# Patient Record
Sex: Female | Born: 1985 | Race: White | Hispanic: No | Marital: Married | Smoking: Never smoker
Health system: Southern US, Community
[De-identification: ages and names within clinical notes are randomized; demographics above are authoritative.]

## PROBLEM LIST (undated history)

## (undated) DIAGNOSIS — F32A Depression, unspecified: Secondary | ICD-10-CM

## (undated) DIAGNOSIS — F329 Major depressive disorder, single episode, unspecified: Secondary | ICD-10-CM

## (undated) DIAGNOSIS — F419 Anxiety disorder, unspecified: Secondary | ICD-10-CM

## (undated) HISTORY — DX: Depression, unspecified: F32.A

## (undated) HISTORY — PX: TONSILLECTOMY: SHX5217

## (undated) HISTORY — DX: Anxiety disorder, unspecified: F41.9

## (undated) HISTORY — PX: WISDOM TOOTH EXTRACTION: SHX21

---
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
## 2018-11-14 ENCOUNTER — Encounter: Payer: Self-pay | Admitting: Advanced Practice Midwife

## 2018-11-14 ENCOUNTER — Ambulatory Visit (INDEPENDENT_AMBULATORY_CARE_PROVIDER_SITE_OTHER): Payer: BC Managed Care – PPO | Admitting: Advanced Practice Midwife

## 2018-11-14 ENCOUNTER — Other Ambulatory Visit: Payer: Self-pay

## 2018-11-14 VITALS — BP 100/70 | Ht 64.0 in | Wt 183.0 lb

## 2018-11-14 DIAGNOSIS — Z01419 Encounter for gynecological examination (general) (routine) without abnormal findings: Secondary | ICD-10-CM | POA: Diagnosis not present

## 2018-11-14 DIAGNOSIS — Z30011 Encounter for initial prescription of contraceptive pills: Secondary | ICD-10-CM

## 2018-11-14 MED ORDER — LEVONORGESTREL-ETHINYL ESTRAD 0.15-30 MG-MCG PO TABS: 1.0000 | ORAL_TABLET | Freq: Every day | ORAL | 4 refills | Status: DC

## 2018-11-14 NOTE — Progress Notes (Signed)
Gynecology Annual Exam   PCP: Patient, No Pcp Per  Chief Complaint:  Chief Complaint  Patient presents with  . Gynecologic Exam    History of Present Illness: Patient is a 33 y.o. G1P1001 presents for annual exam. The patient has no gyn complaints today. Her OCP was changed to a generic when she moved here and it has changed her periods to several days of spotting followed by her period. She would like to go back to taking the original pill that was prescribed- Levora.  She moved here from [place] in July. She reports her last PAP smear about a year ago and has never had an abnormal PAP. Requested she sign a release for previous records.   LMP: Patient's last menstrual period was 11/05/2018 (approximate). Average Interval: regular, 28 days Duration of flow: 5 days Heavy Menses: no Clots: no Intermenstrual Bleeding: no Postcoital Bleeding: no Dysmenorrhea: no  The patient is sexually active. She currently uses OCP (estrogen/progesterone) for contraception. She denies dyspareunia. The patient does perform self breast exams.  There is no notable family history of breast or ovarian cancer in her family.  The patient wears seatbelts: yes.   The patient has regular exercise: she walks regularly, she admits healthy lifestyle diet, hydration and sleep.    The patient denies current symptoms of depression.  She was taking Wellbutrin for postpartum depression (her child is 25 years old) but since moving she has not needed to take the medication.   Review of Systems: Review of Systems  Constitutional: Negative.   HENT: Negative.   Eyes:       Redness, irritation  Respiratory: Negative.   Cardiovascular: Negative.   Gastrointestinal: Negative.   Genitourinary: Negative.   Musculoskeletal: Negative.   Skin: Negative.   Neurological: Negative.   Endo/Heme/Allergies: Negative.   Psychiatric/Behavioral: Negative.     Past Medical History:  Past Medical History:  Diagnosis Date   . Anxiety   . Depression     Past Surgical History:  Past Surgical History:  Procedure Laterality Date  . TONSILLECTOMY    . WISDOM TOOTH EXTRACTION      Gynecologic History:  Patient's last menstrual period was 11/05/2018 (approximate). Contraception: OCP (estrogen/progesterone) Last Pap: 1 year ago Results were: no abnormalities   Obstetric History: G1P1001  Family History:  Family History  Problem Relation Age of Onset  . Hypertension Father     Social History:  Social History   Socioeconomic History  . Marital status: Married    Spouse name: Not on file  . Number of children: Not on file  . Years of education: Not on file  . Highest education level: Not on file  Occupational History  . Not on file  Social Needs  . Financial resource strain: Not on file  . Food insecurity    Worry: Not on file    Inability: Not on file  . Transportation needs    Medical: Not on file    Non-medical: Not on file  Tobacco Use  . Smoking status: Never Smoker  . Smokeless tobacco: Never Used  Substance and Sexual Activity  . Alcohol use: Yes  . Drug use: Never  . Sexual activity: Yes    Birth control/protection: Pill  Lifestyle  . Physical activity    Days per week: Not on file    Minutes per session: Not on file  . Stress: Not on file  Relationships  . Social Musician on phone: Not  on file    Gets together: Not on file    Attends religious service: Not on file    Active member of club or organization: Not on file    Attends meetings of clubs or organizations: Not on file    Relationship status: Not on file  . Intimate partner violence    Fear of current or ex partner: Not on file    Emotionally abused: Not on file    Physically abused: Not on file    Forced sexual activity: Not on file  Other Topics Concern  . Not on file  Social History Narrative  . Not on file    Allergies:  Allergies  Allergen Reactions  . Singulair [Montelukast Sodium]  Hives    Medications: Prior to Admission medications   Medication Sig Start Date End Date Taking? Authorizing Provider  traZODone (DESYREL) 50 MG tablet TAKE 1/2 TO 2 TABLETS BY MOUTH AT BEDTIME AS NEEDED 10/19/18  Yes [provider]  levonorgestrel-ethinyl estradiol (LEVORA 0.15/30, 28,) 0.15-30 MG-MCG tablet Take 1 tablet by mouth daily. 11/14/18   Rod Can, CNM    Physical Exam Vitals: Blood pressure 100/70, height 5\' 4"  (1.626 m), weight 183 lb (83 kg), last menstrual period 11/05/2018.  General: NAD HEENT: normocephalic, anicteric Thyroid: no enlargement, no palpable nodules Pulmonary: No increased work of breathing, CTAB Cardiovascular: RRR, distal pulses 2+ Breast: Breast symmetrical, no tenderness, no palpable nodules or masses, no skin or nipple retraction present, no nipple discharge.  No axillary or supraclavicular lymphadenopathy. Abdomen: NABS, soft, non-tender, non-distended.  Umbilicus without lesions.  No hepatomegaly, splenomegaly or masses palpable. No evidence of hernia  Genitourinary: Deferred for no concerns/PAP interval Extremities: no edema, erythema, or tenderness Neurologic: Grossly intact Psychiatric: mood appropriate, affect full    Assessment: 33 y.o. G1P1001 routine annual exam  Plan: Problem List Items Addressed This Visit    None    Visit Diagnoses    Encounter for initial prescription of contraceptive pills    -  Primary   Relevant Medications   levonorgestrel-ethinyl estradiol (LEVORA 0.15/30, 28,) 0.15-30 MG-MCG tablet      1) STI screening  was offered and declined  2)  ASCCP guidelines and rationale discussed.  Patient opts for every 3 years screening interval  3) Contraception - the patient is currently using  OCP (estrogen/progesterone).  She is happy with her current form of contraception and plans to continue  4) Routine healthcare maintenance including cholesterol, diabetes screening discussed Declines  5) Return  in about 1 year (around 11/14/2019) for annual established gyn/sign release for previous PAP records.   Rod Can, [facility] 11/14/2018, 3:58 PM

## 2018-11-14 NOTE — Patient Instructions (Signed)
Health Maintenance, Female Adopting a healthy lifestyle and getting preventive care are important in promoting health and wellness. Ask your health care provider about:  The right schedule for you to have regular tests and exams.  Things you can do on your own to prevent diseases and keep yourself healthy. What should I know about diet, weight, and exercise? Eat a healthy diet   Eat a diet that includes plenty of vegetables, fruits, low-fat dairy products, and lean protein.  Do not eat a lot of foods that are high in solid fats, added sugars, or sodium. Maintain a healthy weight Body mass index (BMI) is used to identify weight problems. It estimates body fat based on height and weight. Your health care provider can help determine your BMI and help you achieve or maintain a healthy weight. Get regular exercise Get regular exercise. This is one of the most important things you can do for your health. Most adults should:  Exercise for at least 150 minutes each week. The exercise should increase your heart rate and make you sweat (moderate-intensity exercise).  Do strengthening exercises at least twice a week. This is in addition to the moderate-intensity exercise.  Spend less time sitting. Even light physical activity can be beneficial. Watch cholesterol and blood lipids Have your blood tested for lipids and cholesterol at 31 years of age, then have this test every 5 years. Have your cholesterol levels checked more often if:  Your lipid or cholesterol levels are high.  You are older than 31 years of age.  You are at high risk for heart disease. What should I know about cancer screening? Depending on your health history and family history, you may need to have cancer screening at various ages. This may include screening for:  Breast cancer.  Cervical cancer.  Colorectal cancer.  Skin cancer.  Lung cancer. What should I know about heart disease, diabetes, and high blood  pressure? Blood pressure and heart disease  High blood pressure causes heart disease and increases the risk of stroke. This is more likely to develop in people who have high blood pressure readings, are of African descent, or are overweight.  Have your blood pressure checked: ? Every 3-5 years if you are 18-39 years of age. ? Every year if you are 40 years old or older. Diabetes Have regular diabetes screenings. This checks your fasting blood sugar level. Have the screening done:  Once every three years after age 40 if you are at a normal weight and have a low risk for diabetes.  More often and at a younger age if you are overweight or have a high risk for diabetes. What should I know about preventing infection? Hepatitis B If you have a higher risk for hepatitis B, you should be screened for this virus. Talk with your health care provider to find out if you are at risk for hepatitis B infection. Hepatitis C Testing is recommended for:  Everyone born from 1945 through 1965.  Anyone with known risk factors for hepatitis C. Sexually transmitted infections (STIs)  Get screened for STIs, including gonorrhea and chlamydia, if: ? You are sexually active and are younger than 31 years of age. ? You are older than 31 years of age and your health care provider tells you that you are at risk for this type of infection. ? Your sexual activity has changed since you were last screened, and you are at increased risk for chlamydia or gonorrhea. Ask your health care provider if   you are at risk.  Ask your health care provider about whether you are at high risk for HIV. Your health care provider may recommend a prescription medicine to help prevent HIV infection. If you choose to take medicine to prevent HIV, you should first get tested for HIV. You should then be tested every 3 months for as long as you are taking the medicine. Pregnancy  If you are about to stop having your period (premenopausal) and  you may become pregnant, seek counseling before you get pregnant.  Take 400 to 800 micrograms (mcg) of folic acid every day if you become pregnant.  Ask for birth control (contraception) if you want to prevent pregnancy. Osteoporosis and menopause Osteoporosis is a disease in which the bones lose minerals and strength with aging. This can result in bone fractures. If you are 65 years old or older, or if you are at risk for osteoporosis and fractures, ask your health care provider if you should:  Be screened for bone loss.  Take a calcium or vitamin D supplement to lower your risk of fractures.  Be given hormone replacement therapy (HRT) to treat symptoms of menopause. Follow these instructions at home: Lifestyle  Do not use any products that contain nicotine or tobacco, such as cigarettes, e-cigarettes, and chewing tobacco. If you need help quitting, ask your health care provider.  Do not use street drugs.  Do not share needles.  Ask your health care provider for help if you need support or information about quitting drugs. Alcohol use  Do not drink alcohol if: ? Your health care provider tells you not to drink. ? You are pregnant, may be pregnant, or are planning to become pregnant.  If you drink alcohol: ? Limit how much you use to 0-1 drink a day. ? Limit intake if you are breastfeeding.  Be aware of how much alcohol is in your drink. In the [place] one drink equals one 12 oz bottle of beer (355 mL), one 5 oz glass of wine (148 mL), or one 1 oz glass of hard liquor (44 mL). General instructions  Schedule regular health, dental, and eye exams.  Stay current with your vaccines.  Tell your health care provider if: ? You often feel depressed. ? You have ever been abused or do not feel safe at home. Summary  Adopting a healthy lifestyle and getting preventive care are important in promoting health and wellness.  Follow your health care provider's instructions about healthy  diet, exercising, and getting tested or screened for diseases.  Follow your health care provider's instructions on monitoring your cholesterol and blood pressure. This information is not intended to replace advice given to you by your health care provider. Make sure you discuss any questions you have with your health care provider. Document Released: 07/31/2010 Document Revised: 01/08/2018 Document Reviewed: 01/08/2018 Elsevier Patient Education  2020 [employer]  

## 2019-12-30 ENCOUNTER — Other Ambulatory Visit: Payer: Self-pay | Admitting: Advanced Practice Midwife

## 2019-12-30 DIAGNOSIS — Z30011 Encounter for initial prescription of contraceptive pills: Secondary | ICD-10-CM

## 2019-12-31 MED ORDER — LEVONORGESTREL-ETHINYL ESTRAD 0.15-30 MG-MCG PO TABS: 1.0000 | ORAL_TABLET | Freq: Every day | ORAL | 1 refills | Status: DC

## 2019-12-31 NOTE — Telephone Encounter (Addendum)
Chart reviewed patient schedule apt for AE 02/04/2020. rf sent. Pt notified.

## 2019-12-31 NOTE — Addendum Note (Signed)
Addended by: Kathlene Cote on: 12/31/2019 10:23 AM   Modules accepted: Orders

## 2019-12-31 NOTE — Telephone Encounter (Addendum)
Patient requesting refill of birth control. She will schedule AE. She needs to start a new pack on Sunday. NO#[phone]

## 2020-02-04 ENCOUNTER — Other Ambulatory Visit: Payer: Self-pay

## 2020-02-04 ENCOUNTER — Encounter: Payer: Self-pay | Admitting: Obstetrics and Gynecology

## 2020-02-04 ENCOUNTER — Other Ambulatory Visit (HOSPITAL_COMMUNITY)
Admission: RE | Admit: 2020-02-04 | Discharge: 2020-02-04 | Disposition: A | Discharge status: Home / Self Care | Payer: BC Managed Care – PPO | Source: Ambulatory Visit | Attending: Obstetrics and Gynecology | Admitting: Obstetrics and Gynecology

## 2020-02-04 ENCOUNTER — Ambulatory Visit (INDEPENDENT_AMBULATORY_CARE_PROVIDER_SITE_OTHER): Payer: BC Managed Care – PPO | Admitting: Obstetrics and Gynecology

## 2020-02-04 ENCOUNTER — Ambulatory Visit: Payer: BC Managed Care – PPO | Admitting: Advanced Practice Midwife

## 2020-02-04 VITALS — BP 118/70 | Ht 64.0 in | Wt 194.4 lb

## 2020-02-04 DIAGNOSIS — Z124 Encounter for screening for malignant neoplasm of cervix: Secondary | ICD-10-CM

## 2020-02-04 DIAGNOSIS — Z0000 Encounter for general adult medical examination without abnormal findings: Secondary | ICD-10-CM

## 2020-02-04 DIAGNOSIS — Z3041 Encounter for surveillance of contraceptive pills: Secondary | ICD-10-CM | POA: Diagnosis not present

## 2020-02-04 DIAGNOSIS — Z01419 Encounter for gynecological examination (general) (routine) without abnormal findings: Secondary | ICD-10-CM | POA: Diagnosis not present

## 2020-02-04 DIAGNOSIS — Z30011 Encounter for initial prescription of contraceptive pills: Secondary | ICD-10-CM

## 2020-02-04 MED ORDER — LEVONORGESTREL-ETHINYL ESTRAD 0.15-30 MG-MCG PO TABS: 1.0000 | ORAL_TABLET | Freq: Every day | ORAL | 12 refills | Status: DC

## 2020-02-04 NOTE — Patient Instructions (Signed)
Institute of Medicine Recommended Dietary Allowances for Calcium and Vitamin D  Age (yr) Calcium Recommended Dietary Allowance (mg/day) Vitamin D Recommended Dietary Allowance (international units/day)  9-18 1,300 600  19-50 1,000 600  51-70 1,200 600  71 and older 1,200 800  Data from Institute of Medicine. Dietary reference intakes: calcium, vitamin D. [place], [place]: National Academies Press; 2011.     Exercising to Stay Healthy To become healthy and stay healthy, it is recommended that you do moderate-intensity and vigorous-intensity exercise. You can tell that you are exercising at a moderate intensity if your heart starts beating faster and you start breathing faster but can still hold a conversation. You can tell that you are exercising at a vigorous intensity if you are breathing much harder and faster and cannot hold a conversation while exercising. Exercising regularly is important. It has many health benefits, such as:  Improving overall fitness, flexibility, and endurance.  Increasing bone density.  Helping with weight control.  Decreasing body fat.  Increasing muscle strength.  Reducing stress and tension.  Improving overall health. How often should I exercise? Choose an activity that you enjoy, and set realistic goals. Your health care provider can help you make an activity plan that works for you. Exercise regularly as told by your health care provider. This may include:  Doing strength training two times a week, such as: ? Lifting weights. ? Using resistance bands. ? Push-ups. ? Sit-ups. ? Yoga.  Doing a certain intensity of exercise for a given amount of time. Choose from these options: ? A total of 150 minutes of moderate-intensity exercise every week. ? A total of 75 minutes of vigorous-intensity exercise every week. ? A mix of moderate-intensity and vigorous-intensity exercise every week. Children, pregnant women, people who have not exercised  regularly, people who are overweight, and older adults may need to talk with a health care provider about what activities are safe to do. If you have a medical condition, be sure to talk with your health care provider before you start a new exercise program. What are some exercise ideas? Moderate-intensity exercise ideas include:  Walking 1 mile (1.6 km) in about 15 minutes.  Biking.  Hiking.  Golfing.  Dancing.  Water aerobics. Vigorous-intensity exercise ideas include:  Walking 4.5 miles (7.2 km) or more in about 1 hour.  Jogging or running 5 miles (8 km) in about 1 hour.  Biking 10 miles (16.1 km) or more in about 1 hour.  Lap swimming.  Roller-skating or in-line skating.  Cross-country skiing.  Vigorous competitive sports, such as football, basketball, and soccer.  Jumping rope.  Aerobic dancing. What are some everyday activities that can help me to get exercise?  Yard work, such as: ? Pushing a lawn mower. ? Raking and bagging leaves.  Washing your car.  Pushing a stroller.  Shoveling snow.  Gardening.  Washing windows or floors. How can I be more active in my day-to-day activities?  Use stairs instead of an elevator.  Take a walk during your lunch break.  If you drive, park your car farther away from your work or school.  If you take public transportation, get off one stop early and walk the rest of the way.  Stand up or walk around during all of your indoor phone calls.  Get up, stretch, and walk around every 30 minutes throughout the day.  Enjoy exercise with a friend. Support to continue exercising will help you keep a regular routine of activity. What guidelines can   I follow while exercising?  Before you start a new exercise program, talk with your health care provider.  Do not exercise so much that you hurt yourself, feel dizzy, or get very short of breath.  Wear comfortable clothes and wear shoes with good support.  Drink plenty of  water while you exercise to prevent dehydration or heat stroke.  Work out until your breathing and your heartbeat get faster. Where to find more information  [place] Department of Health and Human Services: www.hhs.gov  Centers for Disease Control and Prevention (CDC): www.cdc.gov Summary  Exercising regularly is important. It will improve your overall fitness, flexibility, and endurance.  Regular exercise also will improve your overall health. It can help you control your weight, reduce stress, and improve your bone density.  Do not exercise so much that you hurt yourself, feel dizzy, or get very short of breath.  Before you start a new exercise program, talk with your health care provider. This information is not intended to replace advice given to you by your health care provider. Make sure you discuss any questions you have with your health care provider. Document Revised: 12/28/2016 Document Reviewed: 12/06/2016 Elsevier Patient Education  2020 Elsevier Inc.   Budget-Friendly Healthy Eating There are many ways to save money at the grocery store and continue to eat healthy. You can be successful if you:  Plan meals according to your budget.  Make a grocery list and only purchase food according to your grocery list.  Prepare food yourself. What are tips for following this plan?  Reading food labels  Compare food labels between brand name foods and the store brand. Often the nutritional value is the same, but the store brand is lower cost.  Look for products that do not have added sugar, fat, or salt (sodium). These often cost the same but are healthier for you. Products may be labeled as: ? Sugar-free. ? Nonfat. ? Low-fat. ? Sodium-free. ? Low-sodium.  Look for lean ground beef labeled as at least 92% lean and 8% fat. Shopping  Buy only the items on your grocery list and go only to the areas of the store that have the items on your list.  Use coupons only for foods  and brands you normally buy. Avoid buying items you wouldn't normally buy simply because they are on sale.  Check online and in newspapers for weekly deals.  Buy healthy items from the bulk bins when available, such as herbs, spices, flour, pasta, nuts, and dried fruit.  Buy fruits and vegetables that are in season. Prices are usually lower on in-season produce.  Look at the unit price on the price tag. Use it to compare different brands and sizes to find out which item is the best deal.  Choose healthy items that are often low-cost, such as carrots, potatoes, apples, bananas, and oranges. Dried or canned beans are a low-cost protein source.  Buy in bulk and freeze extra food. Items you can buy in bulk include meats, fish, poultry, frozen fruits, and frozen vegetables.  Avoid buying "ready-to-eat" foods, such as pre-cut fruits and vegetables and pre-made salads.  If possible, shop around to discover where you can find the best prices. Consider other retailers such as dollar stores, larger wholesale stores, local fruit and vegetable stands, and farmers markets.  Do not shop when you are hungry. If you shop while hungry, it may be hard to stick to your list and budget.  Resist impulse buying. Use your grocery list as   your official plan for the week.  Buy a variety of vegetables and fruits by purchasing fresh, frozen, and canned items.  Look at the top and bottom shelves for deals. Foods at eye level (eye level of an adult or child) are usually more expensive.  Be efficient with your time when shopping. The more time you spend at the store, the more money you are likely to spend.  To save money when choosing more expensive foods like meats and dairy: ? Choose cheaper cuts of meat, such as bone-in chicken thighs and drumsticks instead of skinless and boneless chicken. When you are ready to prepare the chicken, you can remove the skin yourself to make it healthier. ? Choose lean meats like  chicken or turkey instead of beef. ? Choose canned seafood, such as tuna, salmon, or sardines. ? Buy eggs as a low-cost source of protein. ? Buy dried beans and peas, such as lentils, split peas, or kidney beans instead of meats. Dried beans and peas are a good alternative source of protein. ? Buy the larger tubs of yogurt instead of individual-sized containers.  Choose water instead of sodas and other sweetened beverages.  Avoid buying chips, cookies, and other "junk food." These items are usually expensive and not healthy. Cooking  Make extra food and freeze the extras in meal-sized containers or in individual portions for fast meals and snacks.  Pre-cook on days when you have extra time to prepare meals in advance. You can keep these meals in the fridge or freezer and reheat for a quick meal.  When you come home from the grocery store, wash, peel, and cut fruits and vegetables so they are ready to use and eat. This will help reduce food waste. Meal planning  Do not eat out or get fast food. Prepare food at home.  Make a grocery list and make sure to bring it with you to the store. If you have a smart phone, you could use your phone to create your shopping list.  Plan meals and snacks according to a grocery list and budget you create.  Use leftovers in your meal plan for the week.  Look for recipes where you can cook once and make enough food for two meals.  Include budget-friendly meals like stews, casseroles, and stir-fry dishes.  Try some meatless meals or try "no cook" meals like salads.  Make sure that half your plate is filled with fruits or vegetables. Choose from fresh, frozen, or canned fruits and vegetables. If eating canned, remember to rinse them before eating. This will remove any excess salt added for packaging. Summary  Eating healthy on a budget is possible if you plan your meals according to your budget, purchase according to your budget and grocery list, and  prepare food yourself.  Tips for buying more food on a limited budget include buying generic brands, using coupons only for foods you normally buy, and buying healthy items from the bulk bins when available.  Tips for buying cheaper food to replace expensive food include choosing cheaper, lean cuts of meat, and buying dried beans and peas. This information is not intended to replace advice given to you by your health care provider. Make sure you discuss any questions you have with your health care provider. Document Revised: 01/16/2017 Document Reviewed: 01/16/2017 Elsevier Patient Education  2020 Elsevier Inc.   Bone Health Bones protect organs, store calcium, anchor muscles, and support the whole body. Keeping your bones strong is important, especially as you   get older. You can take actions to help keep your bones strong and healthy. Why is keeping my bones healthy important?  Keeping your bones healthy is important because your body constantly replaces bone cells. Cells get old, and new cells take their place. As we age, we lose bone cells because the body may not be able to make enough new cells to replace the old cells. The amount of bone cells and bone tissue you have is referred to as bone mass. The higher your bone mass, the stronger your bones. The aging process leads to an overall loss of bone mass in the body, which can increase the likelihood of:  Joint pain and stiffness.  Broken bones.  A condition in which the bones become weak and brittle (osteoporosis). A large decline in bone mass occurs in older adults. In women, it occurs about the time of menopause. What actions can I take to keep my bones healthy? Good health habits are important for maintaining healthy bones. This includes eating nutritious foods and exercising regularly. To have healthy bones, you need to get enough of the right minerals and vitamins. Most nutrition experts recommend getting these nutrients from the  foods that you eat. In some cases, taking supplements may also be recommended. Doing certain types of exercise is also important for bone health. What are the nutritional recommendations for healthy bones?  Eating a well-balanced diet with plenty of calcium and vitamin D will help to protect your bones. Nutritional recommendations vary from person to person. Ask your health care provider what is healthy for you. Here are some general guidelines. Get enough calcium Calcium is the most important (essential) mineral for bone health. Most people can get enough calcium from their diet, but supplements may be recommended for people who are at risk for osteoporosis. Good sources of calcium include:  Dairy products, such as low-fat or nonfat milk, cheese, and yogurt.  Dark green leafy vegetables, such as bok choy and broccoli.  Calcium-fortified foods, such as orange juice, cereal, bread, soy beverages, and tofu products.  Nuts, such as almonds. Follow these recommended amounts for daily calcium intake:  Children, age 1-3: 700 mg.  Children, age 4-8: 1,000 mg.  Children, age 9-13: 1,300 mg.  Teens, age 14-18: 1,300 mg.  Adults, age 19-50: 1,000 mg.  Adults, age 51-70: ? Men: 1,000 mg. ? Women: 1,200 mg.  Adults, age 71 or older: 1,200 mg.  Pregnant and breastfeeding females: ? Teens: 1,300 mg. ? Adults: 1,000 mg. Get enough vitamin D Vitamin D is the most essential vitamin for bone health. It helps the body absorb calcium. Sunlight stimulates the skin to make vitamin D, so be sure to get enough sunlight. If you live in a cold climate or you do not get outside often, your health care provider may recommend that you take vitamin D supplements. Good sources of vitamin D in your diet include:  Egg yolks.  Saltwater fish.  Milk and cereal fortified with vitamin D. Follow these recommended amounts for daily vitamin D intake:  Children and teens, age 1-18: 600 international  units.  Adults, age 50 or younger: 400-800 international units.  Adults, age 51 or older: 800-1,000 international units. Get other important nutrients Other nutrients that are important for bone health include:  Phosphorus. This mineral is found in meat, poultry, dairy foods, nuts, and legumes. The recommended daily intake for adult men and adult women is 700 mg.  Magnesium. This mineral is found in seeds, nuts, dark   green vegetables, and legumes. The recommended daily intake for adult men is 400-420 mg. For adult women, it is 310-320 mg.  Vitamin K. This vitamin is found in green leafy vegetables. The recommended daily intake is 120 mg for adult men and 90 mg for adult women. What type of physical activity is best for building and maintaining healthy bones? Weight-bearing and strength-building activities are important for building and maintaining healthy bones. Weight-bearing activities cause muscles and bones to work against gravity. Strength-building activities increase the strength of the muscles that support bones. Weight-bearing and muscle-building activities include:  Walking and hiking.  Jogging and running.  Dancing.  Gym exercises.  Lifting weights.  Tennis and racquetball.  Climbing stairs.  Aerobics. Adults should get at least 30 minutes of moderate physical activity on most days. Children should get at least 60 minutes of moderate physical activity on most days. Ask your health care provider what type of exercise is best for you. How can I find out if my bone mass is low? Bone mass can be measured with an X-ray test called a bone mineral density (BMD) test. This test is recommended for all women who are age 65 or older. It may also be recommended for:  Men who are age 70 or older.  People who are at risk for osteoporosis because of: ? Having bones that break easily. ? Having a long-term disease that weakens bones, such as kidney disease or rheumatoid  arthritis. ? Having menopause earlier than normal. ? Taking medicine that weakens bones, such as steroids, thyroid hormones, or hormone treatment for breast cancer or prostate cancer. ? Smoking. ? Drinking three or more alcoholic drinks a day. If you find that you have a low bone mass, you may be able to prevent osteoporosis or further bone loss by changing your diet and lifestyle. Where can I find more information? For more information, check out the following websites:  National Osteoporosis Foundation: www.nof.org/patients  National Institutes of Health: www.bones.nih.gov  International Osteoporosis Foundation: www.iofbonehealth.org Summary  The aging process leads to an overall loss of bone mass in the body, which can increase the likelihood of broken bones and osteoporosis.  Eating a well-balanced diet with plenty of calcium and vitamin D will help to protect your bones.  Weight-bearing and strength-building activities are also important for building and maintaining strong bones.  Bone mass can be measured with an X-ray test called a bone mineral density (BMD) test. This information is not intended to replace advice given to you by your health care provider. Make sure you discuss any questions you have with your health care provider. Document Revised: 02/11/2017 Document Reviewed: 02/11/2017 Elsevier Patient Education  2020 Elsevier Inc.   

## 2020-02-04 NOTE — Progress Notes (Signed)
Gynecology Annual Exam  PCP: Patient, No Pcp Per  Chief Complaint:  Chief Complaint  Patient presents with  . Gynecologic Exam    Annual Exam    History of Present Illness: Patient is a 35 y.o. G1P1001 presents for annual exam. The patient has no complaints today.   LMP: No LMP recorded. (Menstrual status: Oral contraceptives). Average Interval: regular, 28 days Duration of flow: 4 days Heavy Menses: no Intermenstrual Bleeding: no Dysmenorrhea: no  The patient is sexually active. She denies dyspareunia.  Postcoital Bleeding: no She currently uses OCP (estrogen/progesterone) for contraception.    The patient does perform self breast exams.  There is no notable family history of breast or ovarian cancer in her family.  The patient has regular exercise: yes, walking and weights 3-4 days a week  The patient denies current symptoms of depression.   Reports today she had blurry vision in her left eye and a headache, resolved with taking tylenol and motrin.  Review of Systems: Review of Systems  Constitutional: Negative for chills, fever, malaise/fatigue and weight loss.  HENT: Negative for congestion, hearing loss and sinus pain.   Eyes: Positive for blurred vision. Negative for double vision.  Respiratory: Negative for cough, sputum production, shortness of breath and wheezing.   Cardiovascular: Negative for chest pain, palpitations, orthopnea and leg swelling.  Gastrointestinal: Negative for abdominal pain, constipation, diarrhea, nausea and vomiting.  Genitourinary: Negative for dysuria, flank pain, frequency, hematuria and urgency.  Musculoskeletal: Negative for back pain, falls and joint pain.  Skin: Negative for itching and rash.  Neurological: Positive for headaches. Negative for dizziness.  Psychiatric/Behavioral: Negative for depression, substance abuse and suicidal ideas. The patient is not nervous/anxious.     Past Medical History:  Past Medical History:   Diagnosis Date  . Anxiety   . Depression     Past Surgical History:  Past Surgical History:  Procedure Laterality Date  . TONSILLECTOMY    . WISDOM TOOTH EXTRACTION      Gynecologic History:  No LMP recorded. (Menstrual status: Oral contraceptives). Contraception: OCP (estrogen/progesterone) Last Pap: Results were: 2017 , uncertain result  Obstetric History: G1P1001  Family History:  Family History  Problem Relation Age of Onset  . Hypertension Father     Social History:  Social History   Socioeconomic History  . Marital status: Married    Spouse name: Not on file  . Number of children: Not on file  . Years of education: Not on file  . Highest education level: Not on file  Occupational History  . Not on file  Tobacco Use  . Smoking status: Never Smoker  . Smokeless tobacco: Never Used  Vaping Use  . Vaping Use: Never used  Substance and Sexual Activity  . Alcohol use: Yes  . Drug use: Never  . Sexual activity: Yes    Birth control/protection: Pill  Other Topics Concern  . Not on file  Social History Narrative  . Not on file   Social Determinants of Health   Financial Resource Strain: Not on file  Food Insecurity: Not on file  Transportation Needs: Not on file  Physical Activity: Not on file  Stress: Not on file  Social Connections: Not on file  Intimate Partner Violence: Not on file    Allergies:  Allergies  Allergen Reactions  . Singulair [Montelukast Sodium] Hives    Medications: Prior to Admission medications   Medication Sig Start Date End Date Taking? Authorizing Provider  traZODone (DESYREL) 50  MG tablet TAKE 1/2 TO 2 TABLETS BY MOUTH AT BEDTIME AS NEEDED 10/19/18  Yes [provider]  venlafaxine XR (EFFEXOR-XR) 37.5 MG 24 hr capsule Take by mouth. 01/28/20 01/27/21 Yes [provider]  levonorgestrel-ethinyl estradiol (LEVORA 0.15/30, 28,) 0.15-30 MG-MCG tablet Take 1 tablet by mouth daily. 02/04/20   Homero Fellers, MD    Physical Exam Vitals: Blood pressure 118/70, height 5\' 4"  (1.626 m), weight 194 lb 6.4 oz (88.2 kg).  Physical Exam Constitutional:      Appearance: She is well-developed.  Genitourinary:     Vagina and uterus normal.     There is no lesion on the right labia.     There is no lesion on the left labia.    No lesions in the vagina.     Genitourinary Comments: External: Normal appearing vulva. No lesions noted.  Speculum examination: Normal appearing cervix. No blood in the vaginal vault. No discharge.   Bimanual examination: Uterus midline, non-tender, normal in size, shape and contour.  No CMT. No adnexal masses. No adnexal tenderness. Pelvis not fixed.       Right Adnexa: no mass present.    Left Adnexa: no mass present.    No cervical motion tenderness.  Breasts:     Right: No inverted nipple, mass, nipple discharge or skin change.     Left: No inverted nipple, mass, nipple discharge or skin change.    HENT:     Head: Normocephalic and atraumatic.  Eyes:     Extraocular Movements: EOM normal.  Neck:     Thyroid: No thyromegaly.  Cardiovascular:     Rate and Rhythm: Normal rate and regular rhythm.     Heart sounds: Normal heart sounds.  Pulmonary:     Effort: Pulmonary effort is normal.     Breath sounds: Normal breath sounds.  Abdominal:     General: Bowel sounds are normal. There is no distension.     Palpations: Abdomen is soft. There is no mass.  Musculoskeletal:     Cervical back: Neck supple.  Neurological:     Mental Status: She is alert and oriented to person, place, and time.  Skin:    General: Skin is warm and dry.  Psychiatric:        Mood and Affect: Mood and affect normal.        Behavior: Behavior normal.        Thought Content: Thought content normal.        Judgment: Judgment normal.  Vitals reviewed. Exam conducted with a chaperone present.      Female chaperone present for pelvic and breast  portions of the physical  exam  Assessment: 35 y.o. G1P1001 routine annual exam  Plan: Problem List Items Addressed This Visit   None   Visit Diagnoses    Encounter for annual routine gynecological examination    -  Primary   Health maintenance examination       Encounter for gynecological examination without abnormal finding       Cervical cancer screening       Relevant Orders   Cytology - PAP   Encounter for birth control pills maintenance       Encounter for initial prescription of contraceptive pills       Relevant Medications   levonorgestrel-ethinyl estradiol (LEVORA 0.15/30, 28,) 0.15-30 MG-MCG tablet      1) STI screening was offered and declined  2) ASCCP guidelines and rational discussed.  Patient opts for  every 3 years screening interval  3) Contraception - Education given regarding options for contraception, patient desires to continue OCP.  4) Routine healthcare maintenance including cholesterol, diabetes screening discussed managed by PCP  5) Follow up if headaches or vision changes are recurrent.   Adelene Idler MD, [facility], [facility] 02/04/2020 2:50 PM

## 2020-02-10 LAB — CYTOLOGY - PAP
Comment: NEGATIVE
Diagnosis: NEGATIVE
High risk HPV: NEGATIVE

## 2020-02-17 ENCOUNTER — Other Ambulatory Visit: Payer: Self-pay | Admitting: Advanced Practice Midwife

## 2020-02-17 DIAGNOSIS — Z30011 Encounter for initial prescription of contraceptive pills: Secondary | ICD-10-CM

## 2020-03-12 ENCOUNTER — Other Ambulatory Visit: Payer: Self-pay | Admitting: Obstetrics and Gynecology

## 2020-03-12 DIAGNOSIS — Z30011 Encounter for initial prescription of contraceptive pills: Secondary | ICD-10-CM

## 2020-04-14 ENCOUNTER — Other Ambulatory Visit: Payer: Self-pay | Admitting: Obstetrics and Gynecology

## 2020-04-14 DIAGNOSIS — Z30011 Encounter for initial prescription of contraceptive pills: Secondary | ICD-10-CM

## 2020-09-30 ENCOUNTER — Encounter: Payer: Self-pay | Admitting: Obstetrics and Gynecology

## 2020-09-30 ENCOUNTER — Ambulatory Visit (INDEPENDENT_AMBULATORY_CARE_PROVIDER_SITE_OTHER): Payer: BC Managed Care – PPO | Admitting: Obstetrics and Gynecology

## 2020-09-30 ENCOUNTER — Other Ambulatory Visit: Payer: Self-pay

## 2020-09-30 VITALS — BP 118/70 | Ht 64.0 in | Wt 193.6 lb

## 2020-09-30 DIAGNOSIS — N6311 Unspecified lump in the right breast, upper outer quadrant: Secondary | ICD-10-CM | POA: Diagnosis not present

## 2020-09-30 NOTE — Progress Notes (Signed)
Patient ID: Michelle Sexton, female   DOB: Dec 12, 1985, 35 y.o.   MRN: [id number]  Reason for Consult: Gynecologic Exam   Referred by No ref. provider found  Subjective:     HPI:  Michelle Sexton is a 35 y.o. female she reports that over the last 6 months she has been noticing breast pain in her right breast.  She reports that this pain is present almost every day.  She has not been able to find anything to help with relief.  She has not tried hot or cold compresses.  She has taken a muscle relaxant in the past which did not improve the pain.  Sometimes she will feel sharp electrical pain throughout the breast.  Gynecological History  Patient's last menstrual period was 09/05/2020.  Past Medical History:  Diagnosis Date   Anxiety    Depression    Family History  Problem Relation Age of Onset   Hypertension Father    Past Surgical History:  Procedure Laterality Date   TONSILLECTOMY     WISDOM TOOTH EXTRACTION      Short Social History:  Social History   Tobacco Use   Smoking status: Never   Smokeless tobacco: Never  Substance Use Topics   Alcohol use: Yes    Allergies  Allergen Reactions   Singulair [Montelukast Sodium] Hives    Current Outpatient Medications  Medication Sig Dispense Refill   buPROPion (WELLBUTRIN SR) 150 MG 12 hr tablet Take 150 mg by mouth 2 (two) times daily.     traZODone (DESYREL) 50 MG tablet TAKE 1/2 TO 2 TABLETS BY MOUTH AT BEDTIME AS NEEDED     ALTAVERA 0.15-30 MG-MCG tablet TAKE 1 TABLET BY MOUTH EVERY DAY (Patient not taking: Reported on 09/30/2020) 28 tablet 11   venlafaxine XR (EFFEXOR-XR) 37.5 MG 24 hr capsule Take by mouth.     No current facility-administered medications for this visit.    Review of Systems  Constitutional: Negative for chills, fatigue, fever and unexpected weight change.  HENT: Negative for trouble swallowing.  Eyes: Negative for loss of vision.  Respiratory: Negative for cough, shortness of breath and wheezing.   Cardiovascular: Negative for chest pain, leg swelling, palpitations and syncope.  GI: Negative for abdominal pain, blood in stool, diarrhea, nausea and vomiting.  GU: Negative for difficulty urinating, dysuria, frequency and hematuria.  Musculoskeletal: Negative for back pain, leg pain and joint pain.  Skin: Negative for rash.  Neurological: Negative for dizziness, headaches, light-headedness, numbness and seizures.  Psychiatric: Negative for behavioral problem, confusion, depressed mood and sleep disturbance.       Objective:  Objective   Vitals:   09/30/20 1540  BP: 118/70  Weight: 193 lb 9.6 oz (87.8 kg)  Height: 5\' 4"  (1.626 m)   Body mass index is 33.23 kg/m.  Physical Exam Chest:      Assessment/Plan:     35 yo with Breast pain, patient with fibrocystic breasts small breast lump present in the right breast Breast imaging ordered. Discussed supportive care for breast pain including hot and cold compresses use of Tylenol and ibuprofen as well as compression.  Discussed avoidance of caffeine and use of primrose oil.  Discussed that if these interventions are not successful she can follow-up for review of secondary steps for breast pain management.  More than 20 minutes were spent face to face with the patient in the room, reviewing the medical record, labs and images, and coordinating care for the patient. The plan of management was  discussed in detail and counseling was provided.      Adelene Idler MD [facility], [facility] 09/30/2020 3:55 PM

## 2020-09-30 NOTE — Patient Instructions (Signed)
PATIENT INSTRUCTIONS  FIBROCYSTIC BREAST DISEASE    FOLLOW-UP:  Call your physician should you develop a new breast mass that is different, if one particular lump starts to enlarge, or if nipple discharge develops.     CAUSE:  Many women have some lumpiness within their breasts and these areas at times can become tender during certain times in your menstrual cycle.  These areas tend to feel like a firm rubber ball as compared to a cancer which will more commonly feel hard and almost rock-like.  Fibrocystic breast disease does not in and of itself increase your risk for breast cancer but you should be sure to examine yiour breasts at the same time of the month on a monthly basis.  If there are a lot of areas of lumpiness you should tape a piece of paper on the mirror with a diagram of your breasts, noting where the areas of lumpiness are and their relative size.  You can then refer to this diagram on a monthly basis to keep better track of any changes should they occur.    DIET:  You should try and avoid foods, or at least minimize foods, such as chocolate and caffeine which may cause the symptoms of tenderness to become worse.    ACTIVITY:  You may want to wear a bra that offers additional support, and/or consider a sports bra, especially during those times when your breasts are more tender.    MEDICATIONS:  Taking Vitamin E capsules twice a day along with Evening of Primrose Capsules three times a day, or as directed on the bottle, may help your symptoms.  These are both available over-the-counter and without a prescription. There is clinical evidence that these may help symptoms in some patients.   If your physician has prescribed medication for your fibrocystic breast disease, be sure to take it as instructed on the bottle and let him/her know if you have any side effects.    QUESTIONS:  Please feel free to call your physician  if you have any questions, and they will be glad to assist you.

## 2020-10-06 ENCOUNTER — Telehealth: Payer: Self-pay

## 2020-10-06 NOTE — Telephone Encounter (Signed)
Patient called and said [facility] is requesting diagnostic bilateral [id number]. I sent msg to [facility] to place order.

## 2020-10-10 ENCOUNTER — Other Ambulatory Visit: Payer: Self-pay | Admitting: Obstetrics and Gynecology

## 2020-10-10 ENCOUNTER — Telehealth: Payer: Self-pay

## 2020-10-10 DIAGNOSIS — Z1231 Encounter for screening mammogram for malignant neoplasm of breast: Secondary | ICD-10-CM

## 2020-10-10 NOTE — Telephone Encounter (Signed)
Patient called and said that [facility] needed an additional order for [id number]. I have requested this from [facility]

## 2020-10-14 ENCOUNTER — Ambulatory Visit
Admission: RE | Admit: 2020-10-14 | Discharge: 2020-10-14 | Disposition: A | Discharge status: Home / Self Care | Payer: BC Managed Care – PPO | Source: Ambulatory Visit | Attending: Obstetrics and Gynecology | Admitting: Obstetrics and Gynecology

## 2020-10-14 ENCOUNTER — Other Ambulatory Visit: Payer: Self-pay

## 2020-10-14 DIAGNOSIS — N6311 Unspecified lump in the right breast, upper outer quadrant: Secondary | ICD-10-CM | POA: Insufficient documentation

## 2020-10-14 DIAGNOSIS — Z1231 Encounter for screening mammogram for malignant neoplasm of breast: Secondary | ICD-10-CM | POA: Diagnosis not present

## 2021-01-23 ENCOUNTER — Ambulatory Visit
Admission: EM | Admit: 2021-01-23 | Discharge: 2021-01-23 | Disposition: A | Discharge status: Home / Self Care | Payer: BC Managed Care – PPO | Attending: Emergency Medicine | Admitting: Emergency Medicine

## 2021-01-23 ENCOUNTER — Ambulatory Visit: Admit: 2021-01-23 | Payer: BC Managed Care – PPO

## 2021-01-23 ENCOUNTER — Other Ambulatory Visit: Payer: Self-pay

## 2021-01-23 DIAGNOSIS — J069 Acute upper respiratory infection, unspecified: Secondary | ICD-10-CM

## 2021-01-23 MED ORDER — BENZONATATE 100 MG PO CAPS: 200.0000 mg | ORAL_CAPSULE | Freq: Three times a day (TID) | ORAL | 0 refills | Status: DC

## 2021-01-23 MED ORDER — IPRATROPIUM BROMIDE 0.06 % NA SOLN: 2.0000 | Freq: Four times a day (QID) | NASAL | 12 refills | Status: DC

## 2021-01-23 MED ORDER — PROMETHAZINE-DM 6.25-15 MG/5ML PO SYRP: 5.0000 mL | ORAL_SOLUTION | Freq: Four times a day (QID) | ORAL | 0 refills | Status: DC | PRN

## 2021-01-23 NOTE — Discharge Instructions (Signed)
Use the Atrovent nasal spray, 2 squirts in each nostril every 6 hours, as needed for runny nose and postnasal drip.  Use the Tessalon Perles every 8 hours during the day.  Take them with a small sip of water.  They may give you some numbness to the base of your tongue or a metallic taste in your mouth, this is normal.  Use the Promethazine DM cough syrup at bedtime for cough and congestion.  It will make you drowsy so do not take it during the day.  Use OTC Tylenol and Ibuprofen for fever or body aches.   Return for reevaluation or see your primary care provider for any new or worsening symptoms.

## 2021-01-23 NOTE — ED Provider Notes (Signed)
[facility]    CSN: [id number] Arrival date & time: 01/23/21  1221      History   Chief Complaint Chief Complaint  Patient presents with   Cough    HPI Michelle Sexton is a 35 y.o. female.   HPI  35 year old female here for evaluation of respiratory complaints.  Patient reports that she has been experiencing runny nose nasal congestion, sore throat, hoarseness, bilateral ear pain with right being worse than left, ringing in her ears, productive cough for green sputum, and nausea.  She denies fever, shortness breath or wheezing, vomiting or diarrhea  Past Medical History:  Diagnosis Date   Anxiety    Depression     There are no problems to display for this patient.   Past Surgical History:  Procedure Laterality Date   TONSILLECTOMY     WISDOM TOOTH EXTRACTION      OB History     Gravida  1   Para  1   Term  1   Preterm      AB      Living  1      SAB      IAB      Ectopic      Multiple      Live Births  1            Home Medications    Prior to Admission medications   Medication Sig Start Date End Date Taking? Authorizing Provider  ALTAVERA 0.15-30 MG-MCG tablet TAKE 1 TABLET BY MOUTH EVERY DAY 04/14/20  Yes Schuman, Christanna R, MD  benzonatate (TESSALON) 100 MG capsule Take 2 capsules (200 mg total) by mouth every 8 (eight) hours. 01/23/21  Yes Becky Augusta, NP  buPROPion [facility] SR) 150 MG 12 hr tablet Take 150 mg by mouth 2 (two) times daily. 07/07/20  Yes [provider]  ipratropium (ATROVENT) 0.06 % nasal spray Place 2 sprays into both nostrils 4 (four) times daily. 01/23/21  Yes Becky Augusta, NP  promethazine-dextromethorphan (PROMETHAZINE-DM) 6.25-15 MG/5ML syrup Take 5 mLs by mouth 4 (four) times daily as needed. 01/23/21  Yes Becky Augusta, NP  traZODone (DESYREL) 50 MG tablet TAKE 1/2 TO 2 TABLETS BY MOUTH AT BEDTIME AS NEEDED 10/19/18  Yes [provider]  venlafaxine XR (EFFEXOR-XR) 37.5 MG 24  hr capsule Take by mouth. 01/28/20 01/27/21  [provider]    Family History Family History  Problem Relation Age of Onset   Hypertension Father     Social History Social History   Tobacco Use   Smoking status: Never   Smokeless tobacco: Never  Vaping Use   Vaping Use: Never used  Substance Use Topics   Alcohol use: Yes   Drug use: Never     Allergies   Singulair [montelukast sodium]   Review of Systems Review of Systems  Constitutional:  Negative for activity change, appetite change and fever.  HENT:  Positive for congestion, ear pain, postnasal drip, rhinorrhea, sore throat and tinnitus.   Respiratory:  Positive for cough. Negative for shortness of breath and wheezing.   Gastrointestinal:  Positive for nausea. Negative for diarrhea and vomiting.  Skin:  Negative for rash.  Hematological: Negative.   Psychiatric/Behavioral: Negative.      Physical Exam Triage Vital Signs ED Triage Vitals  Enc Vitals Group     BP 01/23/21 1230 115/79     Pulse Rate 01/23/21 1230 79     Resp 01/23/21 1230 18  Temp 01/23/21 1230 99.1 F (37.3 C)     Temp Source 01/23/21 1230 Oral     SpO2 01/23/21 1230 100 %     Weight 01/23/21 1229 190 lb (86.2 kg)     Height 01/23/21 1229 5\' 4"  (1.626 m)     Head Circumference --      Peak Flow --      Pain Score 01/23/21 1229 0     Pain Loc --      Pain Edu? --      Excl. in GC? --    No data found.  Updated Vital Signs BP 115/79 (BP Location: Left Arm)    Pulse 79    Temp 99.1 F (37.3 C) (Oral)    Resp 18    Ht 5\' 4"  (1.626 m)    Wt 190 lb (86.2 kg)    SpO2 100%    BMI 32.61 kg/m   Visual Acuity Right Eye Distance:   Left Eye Distance:   Bilateral Distance:    Right Eye Near:   Left Eye Near:    Bilateral Near:     Physical Exam Vitals and nursing note reviewed.  Constitutional:      General: She is not in acute distress.    Appearance: Normal appearance. She is not ill-appearing.  HENT:     Head:  Normocephalic and atraumatic.     Right Ear: Tympanic membrane, ear canal and external ear normal. There is no impacted cerumen.     Left Ear: Tympanic membrane, ear canal and external ear normal. There is no impacted cerumen.     Nose: Congestion and rhinorrhea present.     Mouth/Throat:     Mouth: Mucous membranes are moist.     Pharynx: Oropharynx is clear. Posterior oropharyngeal erythema present.  Cardiovascular:     Rate and Rhythm: Normal rate and regular rhythm.     Pulses: Normal pulses.     Heart sounds: Normal heart sounds. No murmur heard.   No gallop.  Pulmonary:     Effort: Pulmonary effort is normal.     Breath sounds: Normal breath sounds. No wheezing, rhonchi or rales.  Musculoskeletal:     Cervical back: Normal range of motion and neck supple.  Lymphadenopathy:     Cervical: No cervical adenopathy.  Skin:    General: Skin is warm and dry.     Capillary Refill: Capillary refill takes less than 2 seconds.     Findings: No erythema or rash.  Neurological:     General: No focal deficit present.     Mental Status: She is alert and oriented to person, place, and time.  Psychiatric:        Mood and Affect: Mood normal.        Behavior: Behavior normal.        Thought Content: Thought content normal.        Judgment: Judgment normal.     UC Treatments / Results  Labs (all labs ordered are listed, but only abnormal results are displayed) Labs Reviewed - No data to display  EKG   Radiology No results found.  Procedures Procedures (including critical care time)  Medications Ordered in UC Medications - No data to display  Initial Impression / Assessment and Plan / UC Course  I have reviewed the triage vital signs and the nursing notes.  Pertinent labs & imaging results that were available during my care of the patient were reviewed by me and considered in  my medical decision making (see chart for details).  Patient is a very pleasant, nontoxic-appearing  35 year old female here for evaluation of respiratory complaints as outlined in HPI above.  On physical exam patient has pearly gray tympanic membranes bilaterally with normal light reflex and clear external auditory canals.  Nasal mucosa is erythematous and edematous with clear nasal discharge in both nares.  Oropharyngeal exam reveals posterior oropharyngeal erythema with clear postnasal drip.  No cervical lymphadenopathy appreciated exam.  Cardiopulmonary exam reveals clear lung sounds in all fields.  Patient does have some hoarseness but she does not have a completely muffled voice.  I suspect that her laryngitis is coming from postnasal drip as result of an upper respiratory infection.  We will treat her with Atrovent nasal spray to help calm down the postnasal drip, Tessalon Perles, and Promethazine DM cough syrup for cough and congestion at bedtime.  Work note provided.   Final Clinical Impressions(s) / UC Diagnoses   Final diagnoses:  Viral URI with cough     Discharge Instructions      Use the Atrovent nasal spray, 2 squirts in each nostril every 6 hours, as needed for runny nose and postnasal drip.  Use the Tessalon Perles every 8 hours during the day.  Take them with a small sip of water.  They may give you some numbness to the base of your tongue or a metallic taste in your mouth, this is normal.  Use the Promethazine DM cough syrup at bedtime for cough and congestion.  It will make you drowsy so do not take it during the day.  Use OTC Tylenol and Ibuprofen for fever or body aches.   Return for reevaluation or see your primary care provider for any new or worsening symptoms.      ED Prescriptions     Medication Sig Dispense Auth. Provider   benzonatate (TESSALON) 100 MG capsule Take 2 capsules (200 mg total) by mouth every 8 (eight) hours. 21 capsule Becky Augusta, NP   ipratropium (ATROVENT) 0.06 % nasal spray Place 2 sprays into both nostrils 4 (four) times daily. 15 mL  Becky Augusta, NP   promethazine-dextromethorphan (PROMETHAZINE-DM) 6.25-15 MG/5ML syrup Take 5 mLs by mouth 4 (four) times daily as needed. 118 mL Becky Augusta, NP      PDMP not reviewed this encounter.   Becky Augusta, NP 01/23/21 1243

## 2021-01-23 NOTE — ED Triage Notes (Signed)
Pt here with C/O cough with production, sore throat, loss of voice, ear pain since Friday. Negative at home Covid.

## 2022-08-31 ENCOUNTER — Ambulatory Visit
Admission: EM | Admit: 2022-08-31 | Discharge: 2022-08-31 | Disposition: A | Discharge status: Home / Self Care | Payer: BC Managed Care – PPO | Source: Home / Self Care

## 2022-08-31 DIAGNOSIS — H9201 Otalgia, right ear: Secondary | ICD-10-CM

## 2022-08-31 MED ORDER — AZITHROMYCIN 250 MG PO TABS: 250.0000 mg | ORAL_TABLET | Freq: Every day | ORAL | 0 refills | Status: DC

## 2022-08-31 NOTE — ED Provider Notes (Signed)
[facility]    CSN: [id number] Arrival date & time: 08/31/22  0844      History   Chief Complaint Chief Complaint  Patient presents with   Ear Pain    HPI Michelle Sexton is a 37 y.o. female.   Patient presents for evaluation of right-sided ear pain beginning overnight.  Awoken from sleep due to pain, associated pruritus.  Has accompanying upper respiratory infection, experiencing congestion, cough.  Managing symptoms with Mucinex and additional over-the-counter medications.  Denies presence of fever.    Past Medical History:  Diagnosis Date   Anxiety    Depression     There are no problems to display for this patient.   Past Surgical History:  Procedure Laterality Date   TONSILLECTOMY     WISDOM TOOTH EXTRACTION      OB History     Gravida  1   Para  1   Term  1   Preterm      AB      Living  1      SAB      IAB      Ectopic      Multiple      Live Births  1            Home Medications    Prior to Admission medications   Medication Sig Start Date End Date Taking? Authorizing Provider  ALTAVERA 0.15-30 MG-MCG tablet TAKE 1 TABLET BY MOUTH EVERY DAY 04/14/20  Yes Schuman, Christanna R, MD  amphetamine-dextroamphetamine (ADDERALL XR) 10 MG 24 hr capsule Take by mouth. 08/30/22 11/28/22 Yes [provider]  ipratropium (ATROVENT) 0.06 % nasal spray Place 2 sprays into both nostrils 4 (four) times daily. 01/23/21  Yes Becky Augusta, NP  traZODone (DESYREL) 50 MG tablet TAKE 1/2 TO 2 TABLETS BY MOUTH AT BEDTIME AS NEEDED 10/19/18  Yes [provider]  benzonatate (TESSALON) 100 MG capsule Take 2 capsules (200 mg total) by mouth every 8 (eight) hours. 01/23/21   Becky Augusta, NP  buPROPion [facility] SR) 150 MG 12 hr tablet Take 150 mg by mouth 2 (two) times daily. 07/07/20   [provider]  promethazine-dextromethorphan (PROMETHAZINE-DM) 6.25-15 MG/5ML syrup Take 5 mLs by mouth 4 (four) times daily as needed.  01/23/21   Becky Augusta, NP  venlafaxine XR (EFFEXOR-XR) 37.5 MG 24 hr capsule Take by mouth. 01/28/20 01/27/21  [provider]    Family History Family History  Problem Relation Age of Onset   Hypertension Father     Social History Social History   Tobacco Use   Smoking status: Never   Smokeless tobacco: Never  Vaping Use   Vaping status: Never Used  Substance Use Topics   Alcohol use: Yes   Drug use: Never     Allergies   Singulair [montelukast sodium]   Review of Systems Review of Systems   Physical Exam Triage Vital Signs ED Triage Vitals  Encounter Vitals Group     BP 08/31/22 0850 112/77     Systolic BP Percentile --      Diastolic BP Percentile --      Pulse Rate 08/31/22 0850 74     Resp --      Temp 08/31/22 0850 98.8 F (37.1 C)     Temp Source 08/31/22 0850 Oral     SpO2 08/31/22 0850 100 %     Weight 08/31/22 0849 150 lb (68 kg)     Height 08/31/22 0849 5'  4" (1.626 m)     Head Circumference --      Peak Flow --      Pain Score 08/31/22 0848 4     Pain Loc --      Pain Education --      Exclude from Growth Chart --    No data found.  Updated Vital Signs BP 112/77 (BP Location: Left Arm)   Pulse 74   Temp 98.8 F (37.1 C) (Oral)   Ht 5\' 4"  (1.626 m)   Wt 150 lb (68 kg)   LMP 06/09/2022   SpO2 100%   BMI 25.75 kg/m   Visual Acuity Right Eye Distance:   Left Eye Distance:   Bilateral Distance:    Right Eye Near:   Left Eye Near:    Bilateral Near:     Physical Exam Constitutional:      Appearance: Normal appearance.  HENT:     Left Ear: Hearing, tympanic membrane, ear canal and external ear normal.     Ears:     Comments: Bubbling to the right membrane without erythema, mild swelling within the ear canal without drainage, erythema, no abnormality to the external ear Eyes:     Extraocular Movements: Extraocular movements intact.  Pulmonary:     Effort: Pulmonary effort is normal.  Neurological:     Mental  Status: She is alert and oriented to person, place, and time. Mental status is at baseline.      UC Treatments / Results  Labs (all labs ordered are listed, but only abnormal results are displayed) Labs Reviewed - No data to display  EKG   Radiology No results found.  Procedures Procedures (including critical care time)  Medications Ordered in UC Medications - No data to display  Initial Impression / Assessment and Plan / UC Course  I have reviewed the triage vital signs and the nursing notes.  Pertinent labs & imaging results that were available during my care of the patient were reviewed by me and considered in my medical decision making (see chart for details).  Otalgia of the right ear  Your pain is in his system, presence of bubbling on exam concerning that symptoms will progress to a sinusitis versus otitis media, discussed with patient, illness as always present for 7 days and worsening, prophylactically placing on antibiotic, azithromycin sent to pharmacy recommended supportive care through continued use of decongestant as well as advised use of nasal spray for support of the sinuses, recommend over-the-counter analgesics and warm compresses to the external ear for management of pain, advised against ear cleaning, given strict precautions for persistent or worsening symptoms to follow-up for reevaluation   Final Clinical Impressions(s) / UC Diagnoses   Final diagnoses:  None   Discharge Instructions   None    ED Prescriptions   None    PDMP not reviewed this encounter.   Valinda Hoar, [place] 08/31/22 [phone]

## 2022-08-31 NOTE — ED Triage Notes (Signed)
PT c/o possible ear infection and right side neck pain x1day  Pt states that she had a cold last week and is recovering

## 2022-08-31 NOTE — Discharge Instructions (Signed)
On exam there is not really there is mild swelling within the canal and there is bubbles on the eardrum which is an indication of infection, does not differentiate between viruses and bacteria, do have concerns that symptoms will progress to a bacterial ear infection versus sinus infection therefore you will be prophylactically placed on antibiotic today to prevent occurrence  Take azithromycin as directed  Begin use of a nasal spray such as Flonase or similar product to help clear out congestion from the sinuses which also may be contributing to your discomfort  You may continue use of Mucinex which helps thin out secretions and allow them to drain from the sinus cavity  You may take Tylenol and/or Motrin every 6 hours as needed for pain  You may hold warm compresses to your outer ear as needed for pain  Until symptoms have resolved avoid ear cleaning or object placement within the canal to prevent further irritation  You may return to urgent care for any further concerns

## 2022-12-18 ENCOUNTER — Ambulatory Visit
Admission: EM | Admit: 2022-12-18 | Discharge: 2022-12-18 | Disposition: A | Discharge status: Home / Self Care | Payer: BC Managed Care – PPO | Attending: Emergency Medicine | Admitting: Emergency Medicine

## 2022-12-18 DIAGNOSIS — B9789 Other viral agents as the cause of diseases classified elsewhere: Secondary | ICD-10-CM | POA: Diagnosis not present

## 2022-12-18 DIAGNOSIS — J988 Other specified respiratory disorders: Secondary | ICD-10-CM | POA: Insufficient documentation

## 2022-12-18 LAB — RESP PANEL BY RT-PCR (RSV, FLU A&B, COVID)  RVPGX2
Influenza A by PCR: NEGATIVE
Influenza B by PCR: NEGATIVE
Resp Syncytial Virus by PCR: NEGATIVE
SARS Coronavirus 2 by RT PCR: NEGATIVE

## 2022-12-18 MED ORDER — ACETAMINOPHEN 325 MG PO TABS
975.0000 mg | ORAL_TABLET | Freq: Once | ORAL | Status: AC
  Administered 2022-12-18: 975 mg via ORAL

## 2022-12-18 NOTE — ED Provider Notes (Signed)
[facility]    CSN: [id number] Arrival date & time: 12/18/22  1416      History   Chief Complaint Chief Complaint  Patient presents with   Fever   Chest Congestion    HPI Michelle Sexton is a 37 y.o. female.   37 year old female, Michelle Sexton, presents to urgent care for evaluation of fever and chest congestion x 3 days.  Tmax 99.6, patient states she was recently exposed to pneumonia and RSV. Pt has been taking ibuprofen without relief.   The history is provided by the patient. No language interpreter was used.    Past Medical History:  Diagnosis Date   Anxiety    Depression     Patient Active Problem List   Diagnosis Date Noted   Viral respiratory illness 12/18/2022    Past Surgical History:  Procedure Laterality Date   TONSILLECTOMY     WISDOM TOOTH EXTRACTION      OB History     Gravida  1   Para  1   Term  1   Preterm      AB      Living  1      SAB      IAB      Ectopic      Multiple      Live Births  1            Home Medications    Prior to Admission medications   Medication Sig Start Date End Date Taking? Authorizing Provider  ALTAVERA 0.15-30 MG-MCG tablet TAKE 1 TABLET BY MOUTH EVERY DAY 04/14/20  Yes Schuman, Christanna R, MD  amphetamine-dextroamphetamine (ADDERALL XR) 10 MG 24 hr capsule Take by mouth. 08/30/22 12/18/22 Yes [provider]  buPROPion (WELLBUTRIN SR) 150 MG 12 hr tablet Take 150 mg by mouth 2 (two) times daily. 07/07/20  Yes [provider]  traZODone (DESYREL) 50 MG tablet TAKE 1/2 TO 2 TABLETS BY MOUTH AT BEDTIME AS NEEDED 10/19/18  Yes [provider]  azithromycin (ZITHROMAX) 250 MG tablet Take 1 tablet (250 mg total) by mouth daily. Take first 2 tablets together, then 1 every day until finished. 08/31/22   White, Elita Boone, NP  benzonatate (TESSALON) 100 MG capsule Take 2 capsules (200 mg total) by mouth every 8 (eight) hours. 01/23/21   Becky Augusta, NP  ipratropium  (ATROVENT) 0.06 % nasal spray Place 2 sprays into both nostrils 4 (four) times daily. 01/23/21   Becky Augusta, NP  promethazine-dextromethorphan (PROMETHAZINE-DM) 6.25-15 MG/5ML syrup Take 5 mLs by mouth 4 (four) times daily as needed. 01/23/21   Becky Augusta, NP  venlafaxine XR (EFFEXOR-XR) 37.5 MG 24 hr capsule Take by mouth. 01/28/20 01/27/21  [provider]    Family History Family History  Problem Relation Age of Onset   Hypertension Father     Social History Social History   Tobacco Use   Smoking status: Never   Smokeless tobacco: Never  Vaping Use   Vaping status: Never Used  Substance Use Topics   Alcohol use: Yes   Drug use: Never     Allergies   Singulair [montelukast sodium] and Montelukast   Review of Systems Review of Systems  Constitutional:  Positive for fever.  Respiratory:  Positive for cough and chest tightness.   All other systems reviewed and are negative.    Physical Exam Triage Vital Signs ED Triage Vitals  Encounter Vitals Group     BP 12/18/22 1428 133/78  Systolic BP Percentile --      Diastolic BP Percentile --      Pulse Rate 12/18/22 1428 (!) 110     Resp 12/18/22 1428 16     Temp 12/18/22 1428 99.6 F (37.6 C)     Temp Source 12/18/22 1428 Oral     SpO2 12/18/22 1428 100 %     Weight 12/18/22 1425 157 lb (71.2 kg)     Height 12/18/22 1425 5\' 4"  (1.626 m)     Head Circumference --      Peak Flow --      Pain Score 12/18/22 1432 5     Pain Loc --      Pain Education --      Exclude from Growth Chart --    No data found.  Updated Vital Signs BP 133/78 (BP Location: Left Arm)   Pulse 99   Temp 99.6 F (37.6 C) (Oral)   Resp 16   Ht 5\' 4"  (1.626 m)   Wt 157 lb (71.2 kg)   SpO2 100%   BMI 26.95 kg/m   Visual Acuity Right Eye Distance:   Left Eye Distance:   Bilateral Distance:    Right Eye Near:   Left Eye Near:    Bilateral Near:     Physical Exam Vitals and nursing note reviewed.  Cardiovascular:      Rate and Rhythm: Regular rhythm. Tachycardia present.     Pulses: Normal pulses.     Heart sounds: Normal heart sounds.     Comments: HR rechecked after tylenol is 99 Pulmonary:     Effort: Pulmonary effort is normal.     Breath sounds: Normal breath sounds and air entry.     Comments: 100% on RA, R 16 unlabored Neurological:     General: No focal deficit present.     Mental Status: She is alert and oriented to person, place, and time.     Cranial Nerves: No cranial nerve deficit.     Sensory: No sensory deficit.      UC Treatments / Results  Labs (all labs ordered are listed, but only abnormal results are displayed) Labs Reviewed  RESP PANEL BY RT-PCR (RSV, FLU A&B, COVID)  [id number]    EKG   Radiology No results found.  Procedures Procedures (including critical care time)  Medications Ordered in UC Medications  acetaminophen (TYLENOL) tablet 975 mg (975 mg Oral Given 12/18/22 1457)    Initial Impression / Assessment and Plan / UC Course  I have reviewed the triage vital signs and the nursing notes.  Pertinent labs & imaging results that were available during my care of the patient were reviewed by me and considered in my medical decision making (see chart for details).  Clinical Course as of 12/18/22 1750  Tue Dec 18, 2022  1520 Flu,covid,rsv are negative. Tylenol given for temp. [JD]    Clinical Course User Index [JD] Elyan Vanwieren, Para March, NP   Discussed exam findings and plan of care with patient, strict go to ER precautions given.   Patient verbalized understanding to this provider. Ddx: Viral respiratory illness, cough, allergies Final Clinical Impressions(s) / UC Diagnoses   Final diagnoses:  Viral respiratory illness     Discharge Instructions      Your covid,flu,rsv are all negative. Most likely you have a viral illness: no antibiotic as indicated at this time, May treat with OTC meds of choice(tylenol,ibuprofen,etc). Make sure to drink plenty of  fluids to stay hydrated(gatorade, water,  popsicles,jello,etc), avoid caffeine products. Follow up with PCP. Return as needed.     ED Prescriptions   None    PDMP not reviewed this encounter.   Clancy Gourd, NP 12/18/22 1751

## 2022-12-18 NOTE — Discharge Instructions (Addendum)
Your covid,flu,rsv are all negative. Most likely you have a viral illness: no antibiotic as indicated at this time, May treat with OTC meds of choice(tylenol,ibuprofen,etc). Make sure to drink plenty of fluids to stay hydrated(gatorade, water, popsicles,jello,etc), avoid caffeine products. Follow up with PCP. Return as needed.

## 2022-12-18 NOTE — ED Triage Notes (Signed)
Pt c/o chest congestion & fever x3 days. Tmax 99.6. States was exposed to pneumonia & RSV.

## 2023-02-15 ENCOUNTER — Ambulatory Visit: Payer: BC Managed Care – PPO

## 2023-02-15 DIAGNOSIS — Z124 Encounter for screening for malignant neoplasm of cervix: Secondary | ICD-10-CM

## 2023-02-15 DIAGNOSIS — Z01419 Encounter for gynecological examination (general) (routine) without abnormal findings: Secondary | ICD-10-CM

## 2023-03-01 ENCOUNTER — Ambulatory Visit (INDEPENDENT_AMBULATORY_CARE_PROVIDER_SITE_OTHER): Payer: BC Managed Care – PPO

## 2023-03-01 VITALS — BP 107/73 | HR 77 | Ht 64.0 in | Wt 162.0 lb

## 2023-03-01 DIAGNOSIS — Z0000 Encounter for general adult medical examination without abnormal findings: Secondary | ICD-10-CM

## 2023-03-01 DIAGNOSIS — Z01419 Encounter for gynecological examination (general) (routine) without abnormal findings: Secondary | ICD-10-CM | POA: Diagnosis not present

## 2023-03-01 NOTE — Assessment & Plan Note (Signed)
-   Reviewed health maintenance topics as documented below. - Most recent pap smear in 2022, repeat cervical cancer screening due 2027 . - Satisfied with current contraception-COCs. Gets refills through PCP - STI screening declined.  - Follows with PCP for other routine screening.

## 2023-03-01 NOTE — Progress Notes (Signed)
Outpatient Gynecology Note: Annual Visit  Assessment/Plan:    Michelle Sexton is a 38 y.o. female G1P1001 with normal well-woman gynecologic exam.   Well woman exam - Reviewed health maintenance topics as documented below. - Most recent pap smear in 2022, repeat cervical cancer screening due 2027 . - Satisfied with current contraception-COCs. Gets refills through PCP - STI screening declined.  - Follows with PCP for other routine screening.      Risk factors identified in Subjective to review: none No orders of the defined types were placed in this encounter.  Current Outpatient Medications  Medication Instructions   ALTAVERA 0.15-30 MG-MCG tablet TAKE 1 TABLET BY MOUTH EVERY DAY   amphetamine-dextroamphetamine (ADDERALL XR) 20 MG 24 hr capsule 20 mg, Daily   buPROPion (WELLBUTRIN SR) 150 mg, 2 times daily   traZODone (DESYREL) 50 MG tablet TAKE 1/2 TO 2 TABLETS BY MOUTH AT BEDTIME AS NEEDED    No follow-ups on file.    Subjective:    Michelle Sexton is a 38 y.o. female G1P1001 who presents for annual wellness visit.   Occupation Sales executive    Lives with husband and daughter    CONCERNS? Once every 2-3 months has some nipple tenderness that resolves without issue.  Well Woman Visit:  GYN HISTORY:  Patient's last menstrual period was 02/14/2023.     Menstrual History: OB History     Gravida  1   Para  1   Term  1   Preterm      AB      Living  1      SAB      IAB      Ectopic      Multiple      Live Births  1           Menarche age: [age] Patient's last menstrual period was 02/14/2023. Period Pattern: (!) Irregular  On birth control, continuous cycling Intermenstrual bleeding, spotting, or discharge? no Urinary incontinence? no  Sexually active: yes Number of sexual partners: one Gender of sexual Partners: male Dyspareunia? no Last pap:was normal in 2022 History of abnormal Pap: one in 2019, HPV neg Gardasil series:  yes STI  history: no STI/HIV testing or immunizations needed? No.  Contraceptive methods: oral contraceptives (estrogen/progesterone)  Health Maintenance > Reviewed breast self-awareness, no family history of breast or ovarian cancer > History of abnormal mammogram: No > Exercise: aerobics, very active > Dietary Supplements: none > Body mass index is 27.81 kg/m.  > Recent dental visit Yes.   > Seat Belt Use: Yes.   > Texting and driving? No. > Guns in the house Yes.  , locked > Concern for alcohol abuse? About 1 drink of alcohol   Tobacco or other drug use: denied. Tobacco Use: Low Risk  (03/01/2023)   Patient History    Smoking Tobacco Use: Never    Smokeless Tobacco Use: Never    Passive Exposure: Not on file     PHQ-2 Score: In last two weeks, how often have you felt: Little interest or pleasure in doing things: Several days (+1) Feeling down, depressed or hopeless: Several days (+1) Score: 2 Situational due to new job.   GAD-2 Over the last 2 weeks, how often have you been bothered by the following problems? Feeling nervous, anxious or on edge: Several days (+1) Not being able to stop or control worrying: Several days (+1)} Score: 2 On Wellbutrin, being followed for anxiety.    _________________________________________________________  Current Outpatient Medications  Medication Sig Dispense Refill   ALTAVERA 0.15-30 MG-MCG tablet TAKE 1 TABLET BY MOUTH EVERY DAY 28 tablet 11   amphetamine-dextroamphetamine (ADDERALL XR) 20 MG 24 hr capsule Take 20 mg by mouth daily.     buPROPion (WELLBUTRIN SR) 150 MG 12 hr tablet Take 150 mg by mouth 2 (two) times daily.     traZODone (DESYREL) 50 MG tablet TAKE 1/2 TO 2 TABLETS BY MOUTH AT BEDTIME AS NEEDED     No current facility-administered medications for this visit.   Allergies  Allergen Reactions   Singulair [Montelukast Sodium] Hives   Montelukast Other (See Comments)    Past Medical History:  Diagnosis Date   Anxiety     Depression    Past Surgical History:  Procedure Laterality Date   TONSILLECTOMY     WISDOM TOOTH EXTRACTION     OB History     Gravida  1   Para  1   Term  1   Preterm      AB      Living  1      SAB      IAB      Ectopic      Multiple      Live Births  1          Social History   Tobacco Use   Smoking status: Never   Smokeless tobacco: Never  Substance Use Topics   Alcohol use: Yes   Social History   Substance and Sexual Activity  Sexual Activity Yes   Birth control/protection: Pill    Immunization History  Administered Date(s) Administered   Moderna Sars-Covid-2 Vaccination 02/13/2019, 03/13/2019, 12/26/2019     Review Of Systems  Constitutional: Denied constitutional symptoms, night sweats, recent illness, fatigue, fever, insomnia and weight loss.  Eyes: Denied eye symptoms, eye pain, photophobia, vision change and visual disturbance.  Ears/Nose/Throat/Neck: Denied ear, nose, throat or neck symptoms, hearing loss, nasal discharge, sinus congestion and sore throat.  Cardiovascular: Denied cardiovascular symptoms, arrhythmia, chest pain/pressure, edema, exercise intolerance, orthopnea and palpitations.  Respiratory: Denied pulmonary symptoms, asthma, pleuritic pain, productive sputum, cough, dyspnea and wheezing.  Gastrointestinal: Denied, gastro-esophageal reflux, melena, nausea and vomiting.  Genitourinary: Denied genitourinary symptoms including symptomatic vaginal discharge, pelvic relaxation issues, and urinary complaints.  Musculoskeletal: Denied musculoskeletal symptoms, stiffness, swelling, muscle weakness and myalgia.  Dermatologic: Denied dermatology symptoms, rash and scar.  Neurologic: Denied neurology symptoms, dizziness, headache, neck pain and syncope.  Psychiatric: Denied psychiatric symptoms, anxiety and depression.  Endocrine: Denied endocrine symptoms including hot flashes and night sweats.      Objective:    BP 107/73    Pulse 77   Ht 5\' 4"  (1.626 m)   Wt 162 lb (73.5 kg)   LMP 02/14/2023   BMI 27.81 kg/m   Constitutional: Well-developed, well-nourished female in no acute distress Neurological: Alert and oriented to person, place, and time Psychiatric: Mood and affect appropriate Skin: No rashes or lesions Neck: Supple without masses. Trachea is midline.Thyroid is normal size without masses Lymphatics: No cervical, axillary, supraclavicular, or inguinal adenopathy noted Respiratory: Clear to auscultation bilaterally. Good air movement with normal work of breathing. Cardiovascular: Regular rate and rhythm. Extremities grossly normal, nontender with no edema; pulses regular Gastrointestinal: Soft, nontender, nondistended. No masses or hernias appreciated. No hepatosplenomegaly. No fluid wave. No rebound or guarding. Breast Exam: normal appearance, no masses or tenderness, Normal to palpation without dominant masses Genitourinary:  deferred    Rectal: deferred  Autumn Messing, CNM  03/01/23 1:56 PM

## 2023-07-12 IMAGING — MG DIGITAL DIAGNOSTIC BILAT W/ TOMO W/ CAD
8 series · 8 of 24 positions shown · non-contrast
Comparison: None.

CLINICAL DATA: Right subareolar breast tenderness, as well as
diffuse right breast pain.

EXAM:
DIGITAL DIAGNOSTIC BILATERAL MAMMOGRAM WITH TOMOSYNTHESIS AND CAD;
ULTRASOUND RIGHT BREAST LIMITED
TECHNIQUE: Bilateral digital diagnostic mammography and breast tomosynthesis
was performed. The images were evaluated with computer-aided
detection.; Targeted ultrasound examination of the right breast was
performed

[R MLO synth-2D]
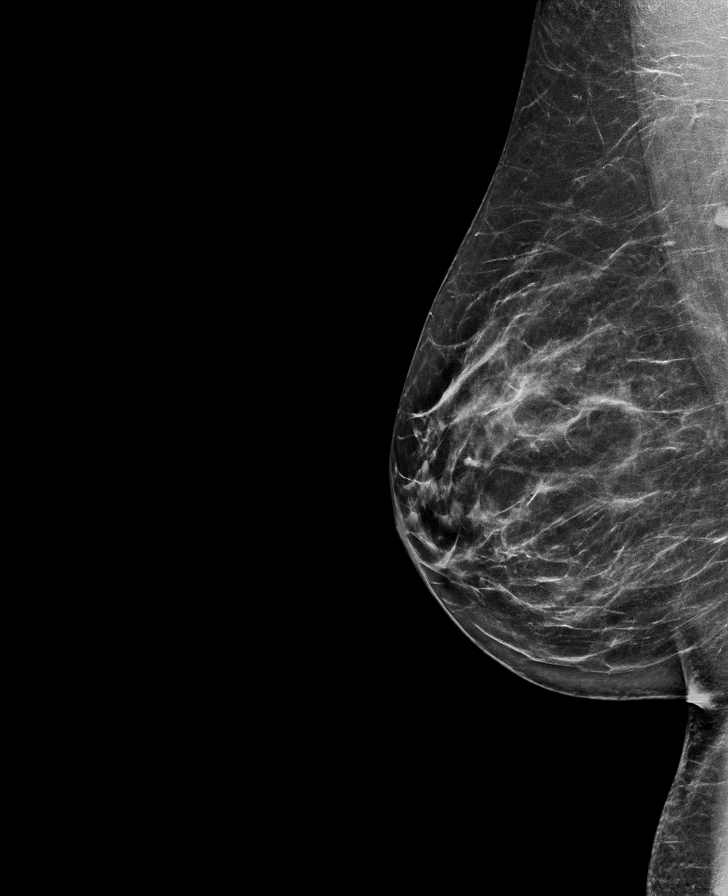

[L CC synth-2D]
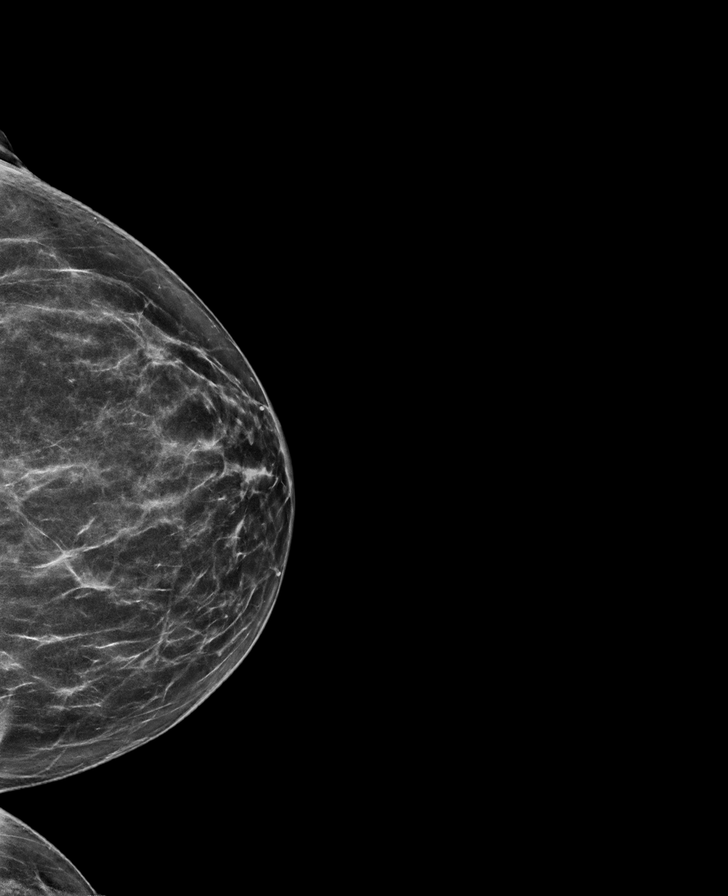

[R CC synth-2D]
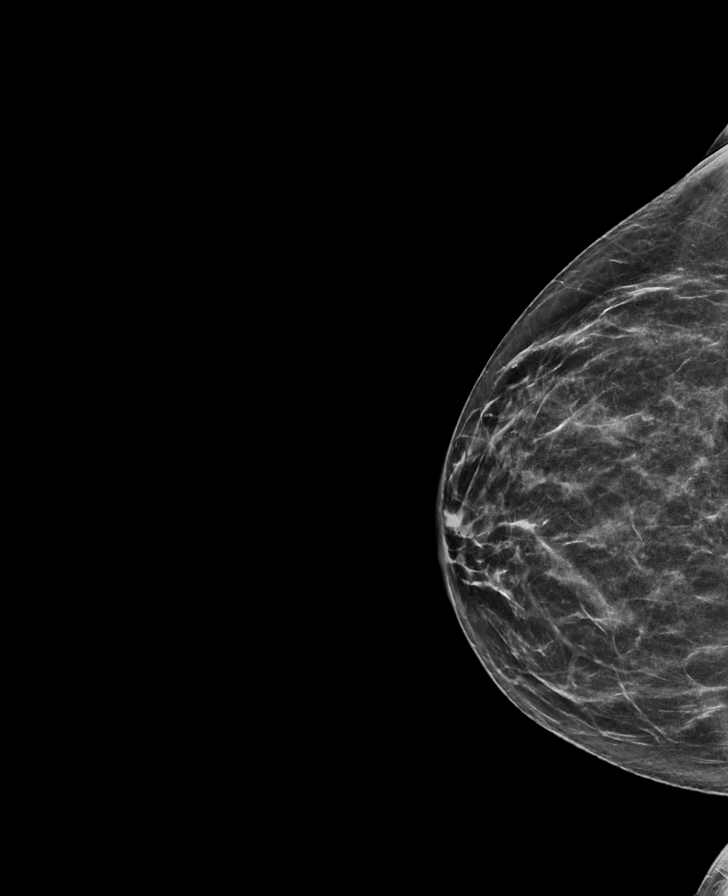

[L MLO synth-2D]
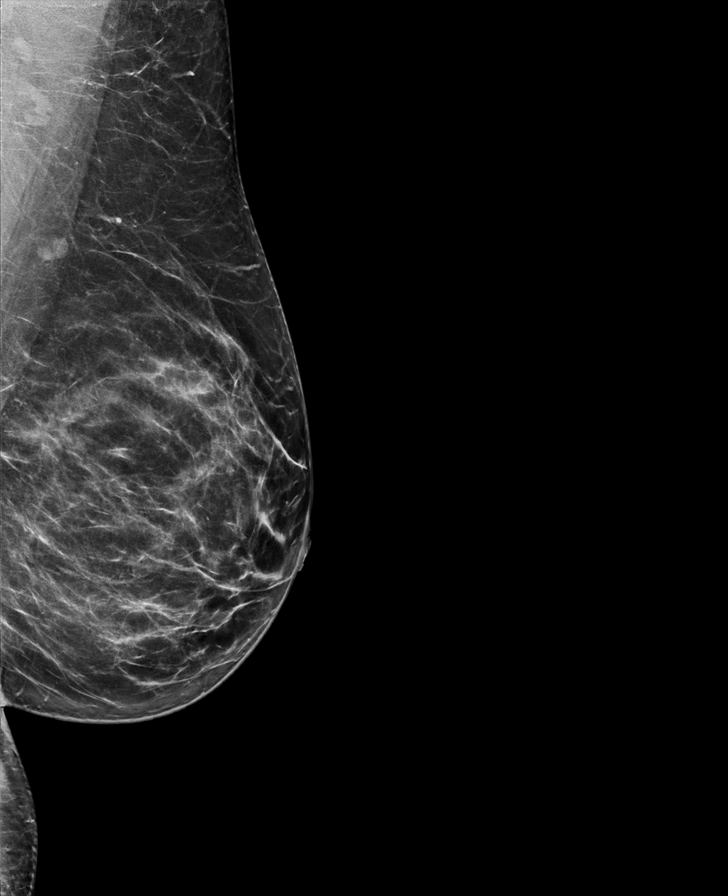

[R MLO tomo · tomo slice 35/70.0]
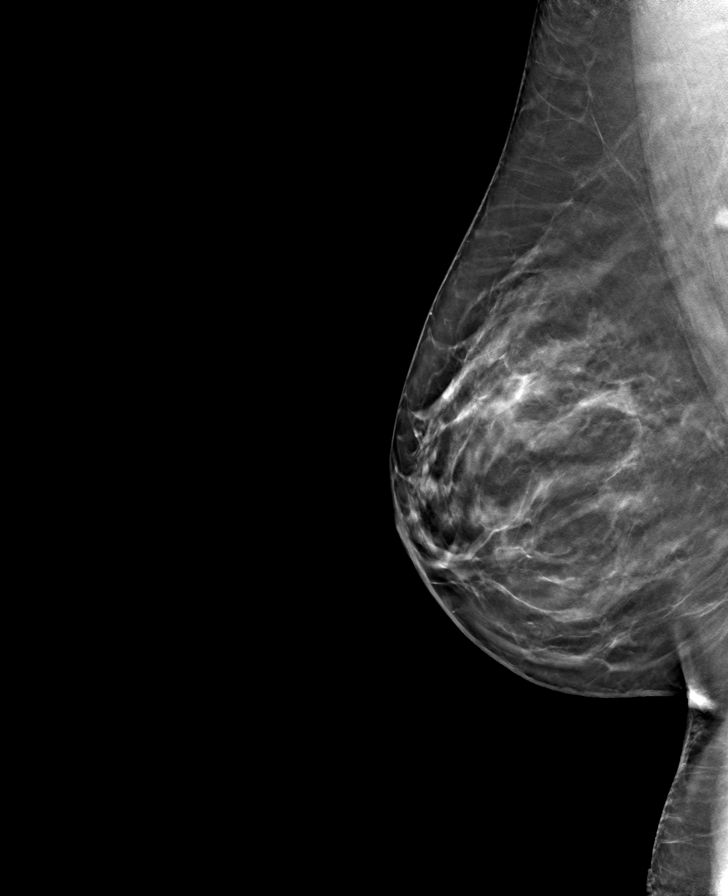

[L MLO tomo · tomo slice 38/75.0]
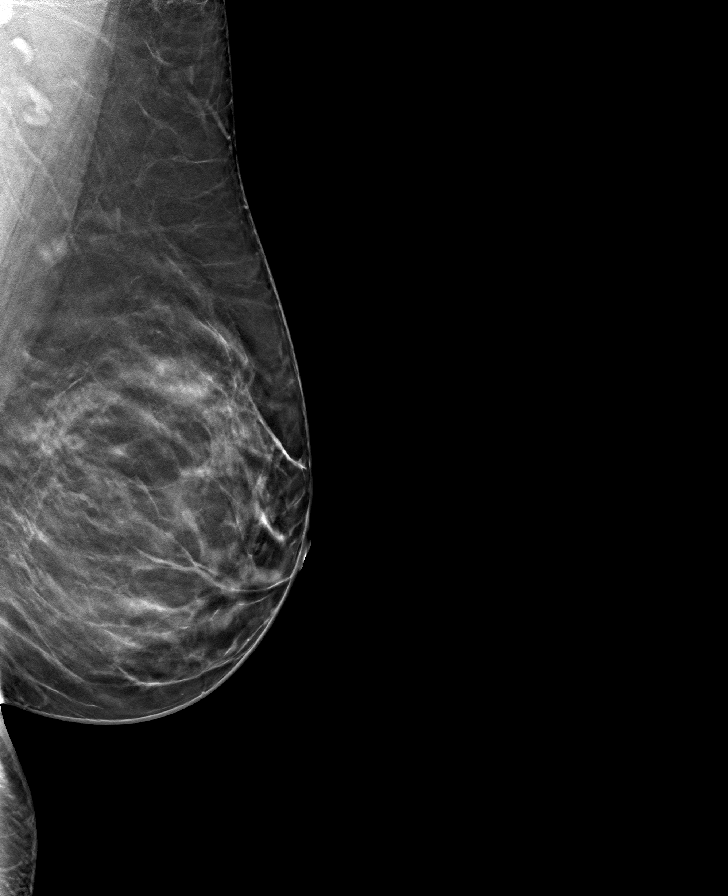

[L CC tomo · tomo slice 33/66.0]
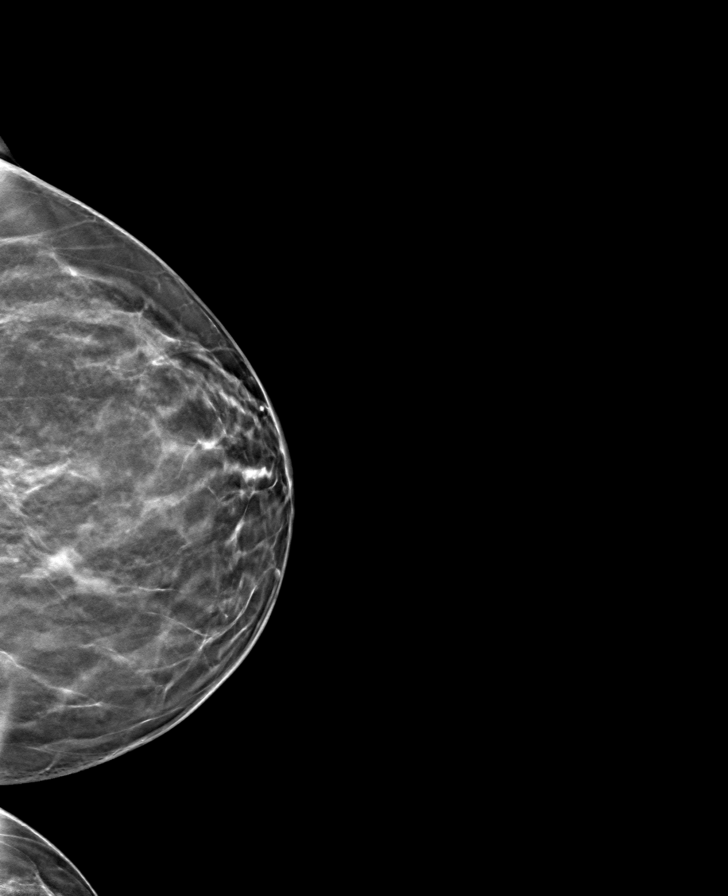

[R CC tomo · tomo slice 31/61.0]
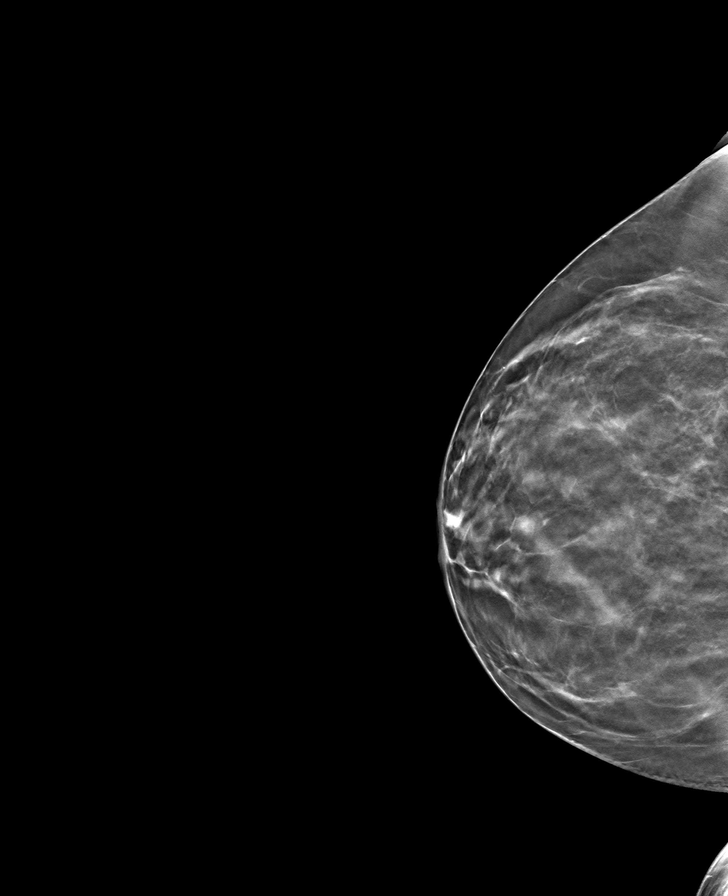

[8 of 24 positions shown; findings below may reference images not displayed]

ACR Breast Density Category c: The breast tissue is heterogeneously
dense, which may obscure small masses.
FINDINGS: Mammographically, there are no suspicious masses, areas of
architectural distortion or microcalcifications in either breast.

Targeted right breast ultrasound is performed demonstrating no
suspicious masses or shadowing lesions.
IMPRESSION: No mammographic or sonographic evidence of breast malignancy.

RECOMMENDATION:
Further management of patient's right breast pain should be based on
clinical grounds.

Screening mammogram at age 42 unless there are persistent or
intervening clinical concerns. (Code:T9-K-Y4D)

I have discussed the findings and recommendations with the patient.
If applicable, a reminder letter will be sent to the patient
regarding the next appointment.

BI-RADS CATEGORY  1: Negative.
